# Patient Record
Sex: Male | Born: 1965 | Race: White | Hispanic: No | Smoking: Never smoker
Health system: Southern US, Community
[De-identification: ages and names within clinical notes are randomized; demographics above are authoritative.]

## PROBLEM LIST (undated history)

## (undated) DIAGNOSIS — J45909 Unspecified asthma, uncomplicated: Secondary | ICD-10-CM

## (undated) DIAGNOSIS — E785 Hyperlipidemia, unspecified: Secondary | ICD-10-CM

## (undated) DIAGNOSIS — K219 Gastro-esophageal reflux disease without esophagitis: Secondary | ICD-10-CM

## (undated) DIAGNOSIS — I10 Essential (primary) hypertension: Secondary | ICD-10-CM

---

## 2002-05-05 ENCOUNTER — Emergency Department (HOSPITAL_COMMUNITY): Admission: EM | Admit: 2002-05-05 | Discharge: 2002-05-05 | Payer: Self-pay | Admitting: Emergency Medicine

## 2008-03-08 ENCOUNTER — Emergency Department (HOSPITAL_BASED_OUTPATIENT_CLINIC_OR_DEPARTMENT_OTHER): Admission: EM | Admit: 2008-03-08 | Discharge: 2008-03-08 | Payer: Self-pay | Admitting: Emergency Medicine

## 2012-09-21 ENCOUNTER — Other Ambulatory Visit: Payer: Self-pay

## 2012-09-21 ENCOUNTER — Emergency Department (HOSPITAL_BASED_OUTPATIENT_CLINIC_OR_DEPARTMENT_OTHER): Payer: BC Managed Care – PPO

## 2012-09-21 ENCOUNTER — Emergency Department (HOSPITAL_BASED_OUTPATIENT_CLINIC_OR_DEPARTMENT_OTHER)
Admission: EM | Admit: 2012-09-21 | Discharge: 2012-09-21 | Disposition: A | Discharge status: Home / Self Care | Payer: BC Managed Care – PPO | Attending: Emergency Medicine | Admitting: Emergency Medicine

## 2012-09-21 ENCOUNTER — Encounter (HOSPITAL_BASED_OUTPATIENT_CLINIC_OR_DEPARTMENT_OTHER): Payer: Self-pay | Admitting: Emergency Medicine

## 2012-09-21 DIAGNOSIS — E785 Hyperlipidemia, unspecified: Secondary | ICD-10-CM | POA: Insufficient documentation

## 2012-09-21 DIAGNOSIS — K219 Gastro-esophageal reflux disease without esophagitis: Secondary | ICD-10-CM | POA: Insufficient documentation

## 2012-09-21 DIAGNOSIS — T7840XA Allergy, unspecified, initial encounter: Secondary | ICD-10-CM

## 2012-09-21 DIAGNOSIS — T450X5A Adverse effect of antiallergic and antiemetic drugs, initial encounter: Secondary | ICD-10-CM | POA: Insufficient documentation

## 2012-09-21 DIAGNOSIS — Z79899 Other long term (current) drug therapy: Secondary | ICD-10-CM | POA: Insufficient documentation

## 2012-09-21 DIAGNOSIS — I10 Essential (primary) hypertension: Secondary | ICD-10-CM | POA: Insufficient documentation

## 2012-09-21 DIAGNOSIS — L299 Pruritus, unspecified: Secondary | ICD-10-CM | POA: Insufficient documentation

## 2012-09-21 DIAGNOSIS — R21 Rash and other nonspecific skin eruption: Secondary | ICD-10-CM | POA: Insufficient documentation

## 2012-09-21 DIAGNOSIS — R197 Diarrhea, unspecified: Secondary | ICD-10-CM

## 2012-09-21 HISTORY — DX: Gastro-esophageal reflux disease without esophagitis: K21.9

## 2012-09-21 HISTORY — DX: Essential (primary) hypertension: I10

## 2012-09-21 HISTORY — DX: Hyperlipidemia, unspecified: E78.5

## 2012-09-21 HISTORY — DX: Unspecified asthma, uncomplicated: J45.909

## 2012-09-21 LAB — CBC WITH DIFFERENTIAL/PLATELET
Eosinophils Absolute: 0.1 10*3/uL (ref 0.0–0.7)
Eosinophils Relative: 1 % (ref 0–5)
Hemoglobin: 18.9 g/dL — ABNORMAL HIGH (ref 13.0–17.0)
Lymphs Abs: 2.7 10*3/uL (ref 0.7–4.0)
MCH: 29.4 pg (ref 26.0–34.0)
MCV: 83.2 fL (ref 78.0–100.0)
Monocytes Relative: 5 % (ref 3–12)
Neutrophils Relative %: 79 % — ABNORMAL HIGH (ref 43–77)
RBC: 6.42 MIL/uL — ABNORMAL HIGH (ref 4.22–5.81)

## 2012-09-21 LAB — HEPATIC FUNCTION PANEL
ALT: 66 U/L — ABNORMAL HIGH (ref 0–53)
Alkaline Phosphatase: 91 U/L (ref 39–117)
Bilirubin, Direct: <0.1 mg/dL (ref 0.0–0.3)
Total Bilirubin: 0.4 mg/dL (ref 0.3–1.2)
Total Protein: 7.2 g/dL (ref 6.0–8.3)

## 2012-09-21 LAB — URINE MICROSCOPIC-ADD ON

## 2012-09-21 LAB — URINALYSIS, ROUTINE W REFLEX MICROSCOPIC
Glucose, UA: NEGATIVE mg/dL
Hgb urine dipstick: NEGATIVE
Protein, ur: 30 mg/dL — AB
pH: 5.5 (ref 5.0–8.0)

## 2012-09-21 LAB — BASIC METABOLIC PANEL
BUN: 20 mg/dL (ref 6–23)
Calcium: 9.4 mg/dL (ref 8.4–10.5)
Creatinine, Ser: 0.9 mg/dL (ref 0.50–1.35)
GFR calc Af Amer: >90 mL/min (ref >90)
GFR calc non Af Amer: >90 mL/min (ref >90)
Potassium: 3.7 mEq/L (ref 3.5–5.1)

## 2012-09-21 LAB — RAPID URINE DRUG SCREEN, HOSP PERFORMED
Amphetamines: NOT DETECTED
Benzodiazepines: NOT DETECTED
Opiates: NOT DETECTED
Tetrahydrocannabinol: NOT DETECTED

## 2012-09-21 MED ORDER — IOHEXOL 300 MG/ML  SOLN
100.0000 mL | Freq: Once | INTRAMUSCULAR | Status: AC | PRN
  Administered 2012-09-21: 100 mL via INTRAVENOUS

## 2012-09-21 MED ORDER — IOHEXOL 300 MG/ML  SOLN
50.0000 mL | Freq: Once | INTRAMUSCULAR | Status: AC | PRN
  Administered 2012-09-21: 50 mL via ORAL

## 2012-09-21 MED ORDER — ONDANSETRON HCL 4 MG/2ML IJ SOLN
4.0000 mg | Freq: Once | INTRAMUSCULAR | Status: AC
  Administered 2012-09-21: 4 mg via INTRAVENOUS

## 2012-09-21 MED ORDER — OXYCODONE-ACETAMINOPHEN 5-325 MG PO TABS
1.0000 | ORAL_TABLET | ORAL | Status: AC | PRN
Start: 2012-09-21 — End: ?

## 2012-09-21 MED ORDER — FAMOTIDINE IN NACL 20-0.9 MG/50ML-% IV SOLN
20.0000 mg | Freq: Once | INTRAVENOUS | Status: AC
  Administered 2012-09-21: 20 mg via INTRAVENOUS
  Filled 2012-09-21: qty 50

## 2012-09-21 MED ORDER — DIPHENHYDRAMINE HCL 50 MG/ML IJ SOLN
25.0000 mg | Freq: Once | INTRAMUSCULAR | Status: AC
  Administered 2012-09-21: 25 mg via INTRAVENOUS
  Filled 2012-09-21: qty 1

## 2012-09-21 MED ORDER — GI COCKTAIL ~~LOC~~
30.0000 mL | Freq: Once | ORAL | Status: AC
  Administered 2012-09-21: 30 mL via ORAL
  Filled 2012-09-21: qty 30

## 2012-09-21 MED ORDER — FENTANYL CITRATE 0.05 MG/ML IJ SOLN
100.0000 ug | Freq: Once | INTRAMUSCULAR | Status: DC
  Filled 2012-09-21: qty 2

## 2012-09-21 MED ORDER — ONDANSETRON HCL 4 MG/2ML IJ SOLN
INTRAMUSCULAR | Status: AC
  Administered 2012-09-21: 4 mg via INTRAVENOUS
  Filled 2012-09-21: qty 2

## 2012-09-21 NOTE — ED Notes (Signed)
Pt developed sharp pains in epigastric region today, that were off an don, this am around 3am, pt awoke with severe epigastric pain and flushed red skin, palms and feet were itching

## 2012-09-21 NOTE — ED Provider Notes (Signed)
History     CSN: [id number]  Arrival date & time 09/21/12  0409   First MD Initiated Contact with Patient 09/21/12 0445      Chief Complaint  Patient presents with  . Abdominal Pain    (Consider location/radiation/quality/duration/timing/severity/associated sxs/prior treatment) Patient is a 47 y.o. male presenting with abdominal pain and rash. The history is provided by the patient.  Abdominal Pain Pain location:  Epigastric Pain quality: sharp   Pain radiates to:  Does not radiate Pain severity:  Severe Onset quality:  Gradual Progression:  Worsening Context: not medication withdrawal and not previous surgeries   Context comment:  Recently restarted reglan Relieved by:  Nothing Worsened by:  Nothing tried Ineffective treatments:  None tried Associated symptoms: diarrhea   Associated symptoms: no chest pain, no cough, no fever and no shortness of breath   Diarrhea:    Quality:  Watery Risk factors: has not had multiple surgeries   Rash Location:  Full body Quality: itchiness and redness   Severity:  Moderate Onset quality:  Sudden Timing:  Constant Progression:  Unchanged Chronicity:  New Context comment:  Following taking aspirin for his abdominal pain Relieved by:  Nothing Worsened by:  Nothing tried Associated symptoms: abdominal pain and diarrhea   Associated symptoms: no fever and no shortness of breath     Past Medical History  Diagnosis Date  . Hypertension   . Hyperlipemia   . GERD (gastroesophageal reflux disease)     History reviewed. No pertinent past surgical history.  History reviewed. No pertinent family history.  History  Substance Use Topics  . Smoking status: Never Smoker   . Smokeless tobacco: Not on file  . Alcohol Use: No      Review of Systems  Constitutional: Negative for fever.  Respiratory: Negative for cough and shortness of breath.   Cardiovascular: Negative for chest pain and leg swelling.  Gastrointestinal: Positive  for abdominal pain and diarrhea.  Skin: Positive for rash.  All other systems reviewed and are negative.    Allergies  Review of patient's allergies indicates no known allergies.  Home Medications   Current Outpatient Rx  Name  Route  Sig  Dispense  Refill  . lisinopril (PRINIVIL,ZESTRIL) 10 MG tablet   Oral   Take 10 mg by mouth daily.         Marland Kitchen omeprazole (PRILOSEC) 10 MG capsule   Oral   Take 10 mg by mouth daily.         . rosuvastatin (CRESTOR) 10 MG tablet   Oral   Take 10 mg by mouth daily.           BP 113/82  Pulse 110  Resp 22  Ht 6' (1.829 m)  Wt 255 lb (115.667 kg)  BMI 34.58 kg/m2  SpO2 91%  Physical Exam  Constitutional: He is oriented to person, place, and time. He appears well-developed and well-nourished. No distress.  HENT:  Head: Normocephalic and atraumatic.  Mouth/Throat: Oropharynx is clear and moist.  Eyes: Conjunctivae are normal. Pupils are equal, round, and reactive to light.  Neck: Normal range of motion. Neck supple.  Cardiovascular: Normal rate, regular rhythm and intact distal pulses.   Pulmonary/Chest: Effort normal and breath sounds normal. He has no wheezes. He has no rales.  Abdominal: Soft. Bowel sounds are normal. He exhibits no mass. There is tenderness in the epigastric area. There is no rebound and no guarding.  Musculoskeletal: Normal range of motion.  Neurological: He is alert  and oriented to person, place, and time.  Skin: Skin is warm and dry.  Psychiatric: He has a normal mood and affect.    ED Course  Procedures (including critical care time)  Labs Reviewed  CBC WITH DIFFERENTIAL  BASIC METABOLIC PANEL  URINALYSIS, ROUTINE W REFLEX MICROSCOPIC  TROPONIN I  URINE RAPID DRUG SCREEN (HOSP PERFORMED)   No results found.   No diagnosis found.    MDM   Date: 09/21/2012  Rate: 98  Rhythm: normal sinus rhythm  QRS Axis: normal  Intervals: normal  ST/T Wave abnormalities: normal  Conduction  Disutrbances: none  Narrative Interpretation: unremarkable      Signed out to Dr. Deretha Emory pending the results of the CT of the abdomen.        Jasmine Awe, MD 09/21/12 [phone]

## 2012-09-21 NOTE — ED Notes (Signed)
Pt notes that he has been feeling really bad for several weeks, has been seeing dr. Brock Bad with [facility], has had multiple testing done, has follow up appt this coming Monday to review different test results

## 2012-09-21 NOTE — ED Provider Notes (Signed)
CT scan shows evidence of some small bowel inflammatory or infectious changes. Followup with the patient's GI doctor will be important. Treatment with pain medication at this point in time will be appropriate. No evidence of bowel obstruction or acute abdominal process.  Results for orders placed during the hospital encounter of 09/21/12  CBC WITH DIFFERENTIAL      Result Value Range   WBC 17.9 (*) 4.0 - 10.5 K/uL   RBC 6.42 (*) 4.22 - 5.81 MIL/uL   Hemoglobin 18.9 (*) 13.0 - 17.0 g/dL   HCT 16.1 (*) 09.6 - 04.5 %   MCV 83.2  78.0 - 100.0 fL   MCH 29.4  26.0 - 34.0 pg   MCHC 35.4  30.0 - 36.0 g/dL   RDW 40.9  81.1 - 91.4 %   Platelets 273  150 - 400 K/uL   Neutrophils Relative 79 (*) 43 - 77 %   Neutro Abs 14.2 (*) 1.7 - 7.7 K/uL   Lymphocytes Relative 15  12 - 46 %   Lymphs Abs 2.7  0.7 - 4.0 K/uL   Monocytes Relative 5  3 - 12 %   Monocytes Absolute 0.9  0.1 - 1.0 K/uL   Eosinophils Relative 1  0 - 5 %   Eosinophils Absolute 0.1  0.0 - 0.7 K/uL   Basophils Relative 0  0 - 1 %   Basophils Absolute 0.0  0.0 - 0.1 K/uL  URINALYSIS, ROUTINE W REFLEX MICROSCOPIC      Result Value Range   Color, Urine AMBER (*) YELLOW   APPearance CLOUDY (*) CLEAR   Specific Gravity, Urine 1.037 (*) 1.005 - 1.030   pH 5.5  5.0 - 8.0   Glucose, UA NEGATIVE  NEGATIVE mg/dL   Hgb urine dipstick NEGATIVE  NEGATIVE   Bilirubin Urine SMALL (*) NEGATIVE   Ketones, ur 15 (*) NEGATIVE mg/dL   Protein, ur 30 (*) NEGATIVE mg/dL   Urobilinogen, UA 0.2  0.0 - 1.0 mg/dL   Nitrite NEGATIVE  NEGATIVE   Leukocytes, UA NEGATIVE  NEGATIVE  URINE RAPID DRUG SCREEN (HOSP PERFORMED)      Result Value Range   Opiates NONE DETECTED  NONE DETECTED   Cocaine NONE DETECTED  NONE DETECTED   Benzodiazepines NONE DETECTED  NONE DETECTED   Amphetamines NONE DETECTED  NONE DETECTED   Tetrahydrocannabinol NONE DETECTED  NONE DETECTED   Barbiturates NONE DETECTED  NONE DETECTED  BASIC METABOLIC PANEL      Result Value Range    Sodium 139  135 - 145 mEq/L   Potassium 3.7  3.5 - 5.1 mEq/L   Chloride 104  96 - 112 mEq/L   CO2 20  19 - 32 mEq/L   Glucose, Bld 170 (*) 70 - 99 mg/dL   BUN 20  6 - 23 mg/dL   Creatinine, Ser 7.82  0.50 - 1.35 mg/dL   Calcium 9.4  8.4 - 95.6 mg/dL   GFR calc non Af Amer >90  >90 mL/min   GFR calc Af Amer >90  >90 mL/min  TROPONIN I      Result Value Range   Troponin I <0.30  <0.30 ng/mL  LIPASE, BLOOD      Result Value Range   Lipase 26  11 - 59 U/L  HEPATIC FUNCTION PANEL      Result Value Range   Total Protein 7.2  6.0 - 8.3 g/dL   Albumin 4.2  3.5 - 5.2 g/dL   AST 39 (*) 0 -  37 U/L   ALT 66 (*) 0 - 53 U/L   Alkaline Phosphatase 91  39 - 117 U/L   Total Bilirubin 0.4  0.3 - 1.2 mg/dL   Bilirubin, Direct <1.6  0.0 - 0.3 mg/dL   Indirect Bilirubin NOT CALCULATED  0.3 - 0.9 mg/dL  URINE MICROSCOPIC-ADD ON      Result Value Range   Squamous Epithelial / LPF RARE  RARE   WBC, UA 0-2  <3 WBC/hpf   Bacteria, UA MANY (*) RARE   Casts HYALINE CASTS (*) NEGATIVE   Urine-Other MUCOUS PRESENT     Ct Abdomen Pelvis W Contrast  09/21/2012  *RADIOLOGY REPORT*  Clinical Data: Abdominal pain  CT ABDOMEN AND PELVIS WITH CONTRAST  Technique:  Multidetector CT imaging of the abdomen and pelvis was performed following the standard protocol during bolus administration of intravenous contrast.  Contrast: 50mL OMNIPAQUE IOHEXOL 300 MG/ML  SOLN  Comparison: None.  Findings: Lung bases:  The lung bases are clear.  There is no pericardial or pleural effusion.  Abdomen/pelvis:  Fatty infiltration of the liver predominately involves the right hepatic lobe.  No suspicious liver abnormalities are identified.  The gallbladder appears within normal limits. There is no biliary dilatation.  Normal appearance of the pancreas. The spleen is normal.  The adrenal glands are both unremarkable.  The left kidney is normal.  There is a cyst identified arising from the  upper pole of the right kidney which measures  2.2 cm.  Urinary bladder is normal. Prostate gland and seminal vesicles are unremarkable.  There is a normal caliber of the abdominal aorta.  No aneurysm noted.  There is no upper abdominal adenopathy.  There is no pelvic or inguinal adenopathy.  The stomach is normal.  Small bowel loops have a normal caliber. There is mild prominence of the small bowel wall. Edema the within the small bowel mesentery is identified. There is moderate free fluid identified within the pelvis.  No evidence for bowel obstruction.  There is enteric contrast material identified throughout the small bowel up to the level of the hepatic flexure. The appendix is visualized and appears normal.  Moderate amount of free fluid is noted within the pelvis.  No focal fluid collections identified.  There is no evidence for abscess formation.  No evidence for bowel perforation identified.  Bones/Musculoskeletal:  Review of the visualized osseous structures is significant for  lumbar degenerative disc disease.  IMPRESSION:  1. Mild edema of the small bowel wall.  Findings may reflect enteritis and/or duodenitis. 2.  The edema within the small bowel mesentery and moderate free fluid within the pelvis is likely secondary to inflammation or infection.  3.  No evidence for bowel perforation or abscess formation.  4.  Fatty infiltration of the liver.   Original Report Authenticated By: Signa Kell, M.D.       Shelda Jakes, MD 09/21/12 [phone]

## 2012-09-21 NOTE — ED Notes (Signed)
Flushed red skin has improved dramatically, pt reports feeling better after nausea medication,

## 2021-08-05 ENCOUNTER — Other Ambulatory Visit (HOSPITAL_COMMUNITY): Payer: Self-pay | Admitting: Cardiovascular Disease

## 2021-08-05 DIAGNOSIS — R079 Chest pain, unspecified: Secondary | ICD-10-CM

## 2021-08-29 ENCOUNTER — Telehealth (HOSPITAL_COMMUNITY): Payer: Self-pay | Admitting: Emergency Medicine

## 2021-08-29 DIAGNOSIS — R079 Chest pain, unspecified: Secondary | ICD-10-CM

## 2021-08-29 MED ORDER — METOPROLOL TARTRATE 100 MG PO TABS: 100.0000 mg | ORAL_TABLET | Freq: Once | ORAL | 0 refills | Status: AC

## 2021-08-29 NOTE — Telephone Encounter (Signed)
Reaching out to patient to offer assistance regarding upcoming cardiac imaging study; pt verbalizes understanding of appt date/time, parking situation and where to check in, pre-test NPO status and medications ordered, and verified current allergies; name and call back number provided for further questions should they arise ?Rockwell Alexandria RN Navigator Cardiac Imaging ?[facility] ?[phone] office ?[phone] cell ? ?100mg  metoprolol tartrate one time dose prescribed to patients preferred pharm for HR control ?Denies iv issues  ?Arrival 130 ? ?

## 2021-08-31 ENCOUNTER — Ambulatory Visit (HOSPITAL_COMMUNITY)
Admission: RE | Admit: 2021-08-31 | Discharge: 2021-08-31 | Disposition: A | Discharge status: Home / Self Care | Payer: BC Managed Care – PPO | Source: Ambulatory Visit | Attending: Family Medicine | Admitting: Family Medicine

## 2021-08-31 DIAGNOSIS — R079 Chest pain, unspecified: Secondary | ICD-10-CM | POA: Insufficient documentation

## 2021-08-31 DIAGNOSIS — I2510 Atherosclerotic heart disease of native coronary artery without angina pectoris: Secondary | ICD-10-CM | POA: Diagnosis not present

## 2021-08-31 MED ORDER — NITROGLYCERIN 0.4 MG SL SUBL
SUBLINGUAL_TABLET | SUBLINGUAL | Status: AC
  Filled 2021-08-31: qty 2

## 2021-08-31 MED ORDER — IOHEXOL 350 MG/ML SOLN
100.0000 mL | Freq: Once | INTRAVENOUS | Status: AC | PRN
  Administered 2021-08-31: 100 mL via INTRAVENOUS

## 2021-08-31 MED ORDER — NITROGLYCERIN 0.4 MG SL SUBL
0.8000 mg | SUBLINGUAL_TABLET | Freq: Once | SUBLINGUAL | Status: AC
  Administered 2021-08-31: 0.8 mg via SUBLINGUAL

## 2022-12-26 IMAGING — CT CT HEART MORP W/ CTA COR W/ SCORE W/ CA W/CM &/OR W/O CM
4 of 7 series · 8 of 20 positions shown, 9 images · IV contrast (omnipaque)
Comparison: CT abdomen September 21, 2012.
COMPARISON: CT abdomen September 21, 2012.

Addendum:
EXAM:
OVER-READ INTERPRETATION  CT CHEST

The following report is an over-read performed by radiologist Dr.
Gharsallah Las [[email or web address]] on 08/31/2021. This
over-read does not include interpretation of cardiac or coronary
anatomy or pathology. The coronary calcium score/coronary CTA
interpretation by the cardiologist is attached.
HISTORY: 55 yo male with chest pain
Cardiac/Coronary CTA
TECHNIQUE: The patient was scanned on a Siemens Force scanner.
PROTOCOL: A 120 kV prospective scan was triggered in the descending thoracic
aorta at 111 HU's. Axial non-contrast 3 mm slices were carried out
through the heart. The data set was analyzed on a dedicated work
station and scored using the Agatson method. Gantry rotation speed
was 250 msecs and collimation was .6 mm. Beta blockade and 0.8 mg of
sl NTG was given. The 3D data set was reconstructed in 5% intervals
of the 35-75 % of the R-R cycle. Diastolic phases were analyzed on a
dedicated work station using MPR, MIP and VRT modes. The patient
received 100mL OMNIPAQUE IOHEXOL 350 MG/ML SOLN of contrast.

[Series 6: ts diast sharp · axial · 0.45mm/px · z∈[-98,-62]mm · 2 of 278 slices shown]
[im 93/278  lung]
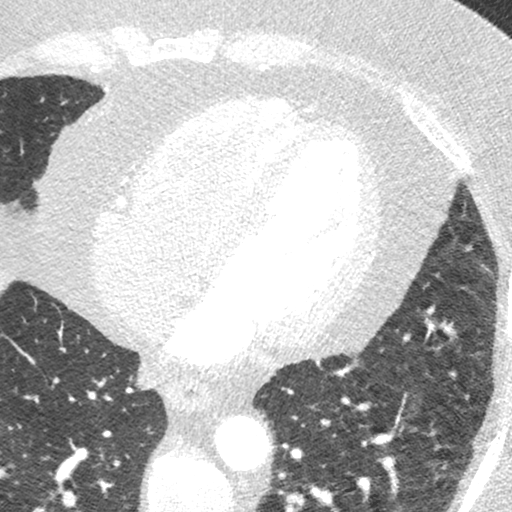
[im 185/278  lung]
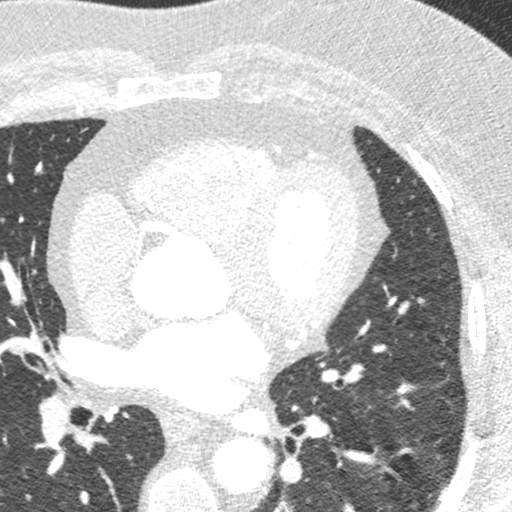

[Series 7: best diast · axial · 0.45mm/px · z∈[-98,-62]mm · 2 of 278 slices shown, 3 images]
[im 93/278  vessel]
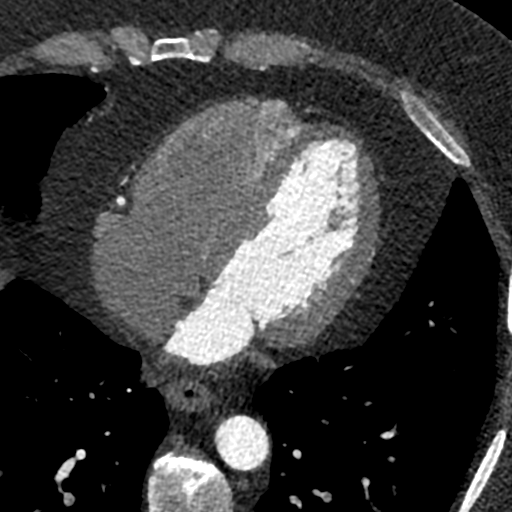
[im 93/278  lung]
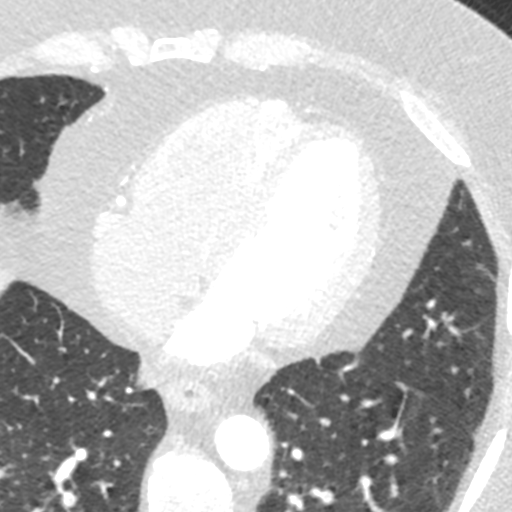
[im 185/278  vessel]
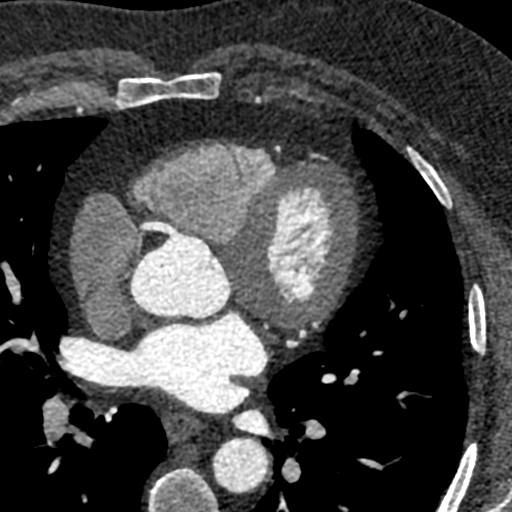

[Series 8: best syst · axial · 0.45mm/px · z∈[-98,-62]mm · 2 of 278 slices shown]
[im 93/278  vessel]
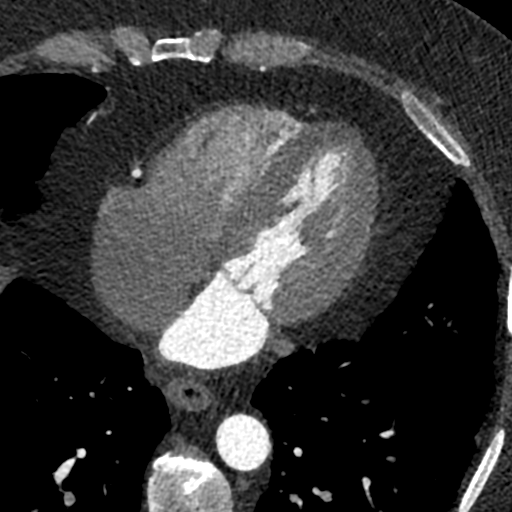
[im 185/278  vessel]
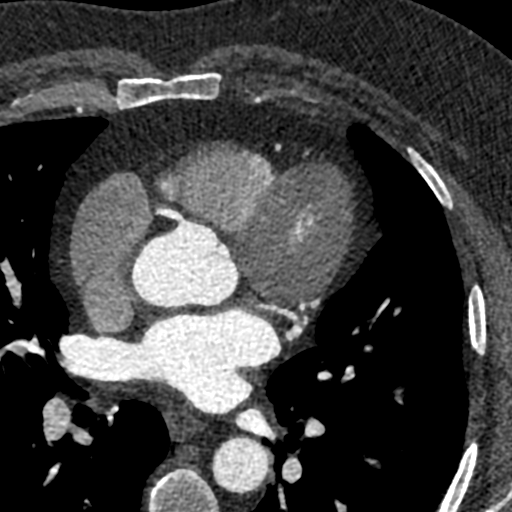

[Series 9: ts syst sharp · axial · 0.45mm/px · z∈[-98,-62]mm · 2 of 278 slices shown]
[im 93/278  lung]
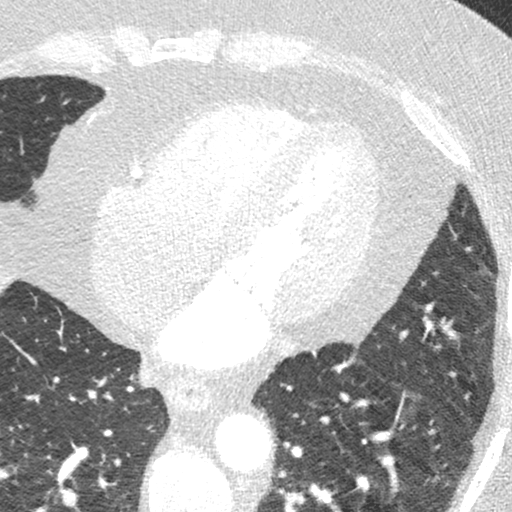
[im 185/278  lung]
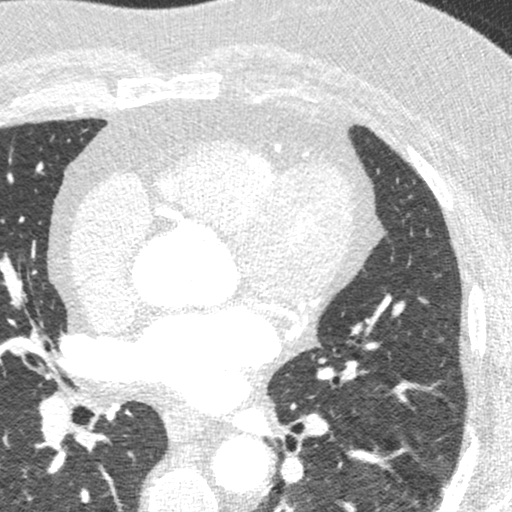

[8 of 20 positions shown; findings below may reference images not displayed]

FINDINGS: Vascular: No acute noncardiac vascular finding.

Mediastinum/Nodes: No pathologically enlarged mediastinal or hilar
lymph nodes within the acquired field-of-view. Visualized portions
of the esophagus are unremarkable.

Lungs/Pleura: No suspicious pulmonary nodules or masses, no focal
airspace consolidation and no pleural effusion or pneumothorax
within the acquired field-of-view.

Upper Abdomen: No acute abnormality.

Musculoskeletal: No acute osseous abnormality.
IMPRESSION: No significant extracardiac findings are demonstrated.
FINDINGS: Quality: Good, HR 65

Coronary calcium score: The patient's coronary artery calcium score
is 532, which places the patient in the 97th percentile.

Coronary arteries: Normal coronary origins.  Right dominance.

Right Coronary Artery: Dominant. Heavily calcified with moderate
50-69% mixed non-opstructive stenosis ([id number]).

Left Main Coronary Artery: Normal. Bifurcates into the LAD and LCX
arteries.

Left Anterior Descending Coronary Artery: Large anterior vessel -
minimal mixed proximal stenosis (1-24%, [id number]). 2 smaller
diagonal branches without disease.

Left Circumflex Artery: Lateral vessel - tortuous, no disease.

Aorta: Normal size, 36 mm at the mid ascending aorta (level of the
PA bifurcation) measured double oblique. Aortic atherosclerosis. No
dissection.

Aortic Valve: Trileaflet. No calcifications.

Other findings:

Normal pulmonary vein drainage into the left atrium.

Normal left atrial appendage without a thrombus.

Normal size of the pulmonary artery.
IMPRESSION: 1. Moderate mixed non-obstructive CAD, primarily in the proximal to
mid RCA, CADRADS = 3.

2. Coronary calcium score of 532. This was 97th percentile for age
and sex matched control.

3. Normal coronary origin with right dominance.

4. Aortic atherosclerosis.

5. Aggressive cardiovascular risk reduction is recommended.

*** End of Addendum ***
EXAM:
OVER-READ INTERPRETATION  CT CHEST

The following report is an over-read performed by radiologist Dr.
Gharsallah Las [[email or web address]] on 08/31/2021. This
over-read does not include interpretation of cardiac or coronary
anatomy or pathology. The coronary calcium score/coronary CTA
interpretation by the cardiologist is attached.
FINDINGS: Vascular: No acute noncardiac vascular finding.

Mediastinum/Nodes: No pathologically enlarged mediastinal or hilar
lymph nodes within the acquired field-of-view. Visualized portions
of the esophagus are unremarkable.

Lungs/Pleura: No suspicious pulmonary nodules or masses, no focal
airspace consolidation and no pleural effusion or pneumothorax
within the acquired field-of-view.

Upper Abdomen: No acute abnormality.

Musculoskeletal: No acute osseous abnormality.
IMPRESSION: No significant extracardiac findings are demonstrated.
